# Patient Record
Sex: Male | Born: 1985 | Race: White | Hispanic: No | Marital: Single | Smoking: Never smoker
Health system: Southern US, Community
[De-identification: ages and names within clinical notes are randomized; demographics above are authoritative.]

## PROBLEM LIST (undated history)

## (undated) DIAGNOSIS — U071 COVID-19: Secondary | ICD-10-CM

## (undated) DIAGNOSIS — J45909 Unspecified asthma, uncomplicated: Secondary | ICD-10-CM

## (undated) DIAGNOSIS — J309 Allergic rhinitis, unspecified: Secondary | ICD-10-CM

## (undated) HISTORY — DX: Allergic rhinitis, unspecified: J30.9

## (undated) HISTORY — DX: COVID-19: U07.1

## (undated) HISTORY — DX: Unspecified asthma, uncomplicated: J45.909

---

## 2019-01-12 ENCOUNTER — Other Ambulatory Visit: Payer: Self-pay | Admitting: *Deleted

## 2019-01-12 DIAGNOSIS — Z20822 Contact with and (suspected) exposure to covid-19: Secondary | ICD-10-CM

## 2019-01-14 LAB — NOVEL CORONAVIRUS, NAA: SARS-CoV-2, NAA: DETECTED — AB

## 2019-02-22 ENCOUNTER — Ambulatory Visit
Admission: RE | Admit: 2019-02-22 | Discharge: 2019-02-22 | Disposition: A | Discharge status: Home / Self Care | Payer: BC Managed Care – PPO | Source: Ambulatory Visit | Attending: Family Medicine | Admitting: Family Medicine

## 2019-02-22 ENCOUNTER — Other Ambulatory Visit: Payer: Self-pay

## 2019-02-22 ENCOUNTER — Other Ambulatory Visit: Payer: Self-pay | Admitting: Family Medicine

## 2019-02-22 DIAGNOSIS — R05 Cough: Secondary | ICD-10-CM

## 2019-02-22 DIAGNOSIS — R0602 Shortness of breath: Secondary | ICD-10-CM | POA: Diagnosis present

## 2019-02-22 DIAGNOSIS — R059 Cough, unspecified: Secondary | ICD-10-CM

## 2019-04-16 ENCOUNTER — Ambulatory Visit: Payer: BC Managed Care – PPO | Attending: Internal Medicine

## 2019-04-16 DIAGNOSIS — Z20822 Contact with and (suspected) exposure to covid-19: Secondary | ICD-10-CM

## 2019-04-17 LAB — NOVEL CORONAVIRUS, NAA: SARS-CoV-2, NAA: NOT DETECTED

## 2019-04-20 ENCOUNTER — Telehealth: Payer: Self-pay | Admitting: *Deleted

## 2019-04-20 NOTE — Telephone Encounter (Signed)
Pt called for COVID results, they were not detected. Pt verbalized understanding.

## 2019-09-30 ENCOUNTER — Encounter: Payer: Self-pay | Admitting: Pulmonary Disease

## 2019-09-30 ENCOUNTER — Ambulatory Visit (INDEPENDENT_AMBULATORY_CARE_PROVIDER_SITE_OTHER): Payer: BC Managed Care – PPO | Admitting: Pulmonary Disease

## 2019-09-30 ENCOUNTER — Other Ambulatory Visit: Payer: Self-pay

## 2019-09-30 VITALS — BP 118/76 | HR 67 | Temp 97.5°F | Ht 68.0 in | Wt 167.8 lb

## 2019-09-30 DIAGNOSIS — J45909 Unspecified asthma, uncomplicated: Secondary | ICD-10-CM

## 2019-09-30 DIAGNOSIS — R0602 Shortness of breath: Secondary | ICD-10-CM

## 2019-09-30 MED ORDER — ALBUTEROL SULFATE (2.5 MG/3ML) 0.083% IN NEBU
2.5000 mg | INHALATION_SOLUTION | Freq: Four times a day (QID) | RESPIRATORY_TRACT | 12 refills | Status: AC | PRN
Start: 2019-09-30 — End: ?

## 2019-09-30 NOTE — Patient Instructions (Signed)
What we discussed today:   We are going to obtain breathing tests  We are going to obtain a heart test  Allergy testing  We will refill your nebulizer medication and put a prescription for a new nebulizer  Stop Singulair (montelukast)  We will see you in follow-up in 4 to 6 weeks time call sooner should any new difficulties arise

## 2019-09-30 NOTE — Progress Notes (Signed)
Subjective:    Patient ID: Patrick Finley, male    DOB: 07/03/1985, 34 y.o.   MRN: [id number]  HPI Patient is a 34 year old lifelong never smoker who presents for evaluation of difficulty breathing for the last 7 to 8 months.  Patient is kindly referred by Jonita Albee, FNP.  Patient states that symptoms started approximately 3 months after he had COVID-19.  Patient states that he has had asthma "all of his life" and he notes that this feeling of breathlessness is different.  He notes that he has inability to get a full breath during mild exercise and his throat begins to "close down aggressively".  He had Covid 19 in October 2020 diagnosed on 20 October.  This lingered towards the first part of November 2020.  In around December is when he started noticing his issues with the dyspnea as noted above.  He states that around that time Advair which he has used since childhood has stopped working with regards to his symptoms of dyspnea.  He states rest makes his symptoms better and exercise exacerbates them.  He does have issues with seasonal allergies.  He has chronic issues with gastroesophageal reflux even as a child.  He is on PPI for the same.  He notes chest pain and heartburn frequently as well as throat pain from reflux.  He notes that the throat pain and chest pain also get aggravated by exercise.  He does describe orthopnea and occasional paroxysmal nocturnal dyspnea that started since his Covid diagnosis.   Review of Systems A 10 point review of systems was performed and it is as noted above otherwise negative.  Past Medical History:  Diagnosis Date  . Allergic rhinitis   . Asthma   . COVID-19    History reviewed. No pertinent surgical history.  Family History  Problem Relation Age of Onset  . Fibromyalgia Mother   . Skin cancer Mother    Social History   Tobacco Use  . Smoking status: Never Smoker  . Smokeless tobacco: Never Used  Substance Use Topics  . Alcohol use: Yes   No  military history.  Has resided previously in [place].  Previously tested for tuberculosis, negative  He works as an Teaching laboratory technician.  Hobbies include martial arts.  No exotic pets.  Does have a cat as a pet.   No Known Allergies  Meds currently taking, reviewed  No immunizations or record, patient has immunizations up to date however does not wish to disclose.    Objective:   Physical Exam BP 118/76 (BP Location: Left Arm, Cuff Size: Normal)   Pulse 67   Temp (!) 97.5 F (36.4 C) (Temporal)   Ht 5\' 8"  (1.727 m)   Wt 167 lb 12.8 oz (76.1 kg)   SpO2 97%   BMI 25.51 kg/m   GENERAL: Well-developed well-nourished young man in no acute distress.  Fully ambulatory.   HEAD: Normocephalic, atraumatic.  EYES: Pupils equal, round, reactive to light.  No scleral icterus.  MOUTH: Nose/mouth/throat not examined due to masking requirements for COVID 19. NECK: Supple. No thyromegaly. Trachea midline. No JVD.  No adenopathy. PULMONARY: Good air entry bilaterally.  No adventitious sounds.   CARDIOVASCULAR: S1 and S2. Regular rate and rhythm.  No rubs, murmurs or gallops heard. ABDOMEN: Nondistended, benign. MUSCULOSKELETAL: No joint deformity, no clubbing, no edema.  NEUROLOGIC: No focal deficits, no gait disturbance, speech is fluent. SKIN: Intact,warm,dry.  Imitate exam, no rashes. PSYCH: Mood and behavior normal.  Chest x-ray performed 02/22/2019: Independently reviewed, hyperinflation present.       Assessment & Plan:     ICD-10-CM   1. Persistent asthma without complication, unspecified asthma severity  J45.909 Pulmonary Function Test [facility] Only    Allergen Panel (27) + IGE    AMB REFERRAL FOR DME   Allergen panel PFTs Continue Advair for now  2. Shortness of breath  R06.02 ECHOCARDIOGRAM COMPLETE   PFTs as above 2D echo to exclude cardiac cause of dyspnea   Orders Placed This Encounter  Procedures  . Allergen Panel (27) + IGE    Standing Status:   Future     Standing Expiration Date:   09/29/2020  . AMB REFERRAL FOR DME    Referral Priority:   Routine    Referral Type:   Durable Medical Equipment Purchase    Number of Visits Requested:   1  . Pulmonary Function Test [facility] Only    Standing Status:   Future    Number of Occurrences:   1    Standing Expiration Date:   09/29/2020    Scheduling Instructions:     urgent    Order Specific Question:   Full PFT: includes the following: basic spirometry, spirometry pre & post bronchodilator, diffusion capacity (DLCO), lung volumes    Answer:   Full PFT  . ECHOCARDIOGRAM COMPLETE    Standing Status:   Future    Number of Occurrences:   1    Standing Expiration Date:   01/31/2020    Order Specific Question:   Where should this test be performed    Answer:   [facility]    Order Specific Question:   Please indicate who you request to read the echo results.    Answer:   [facility] Read[facility]    Order Specific Question:   Perflutren DEFINITY (image enhancing agent) should be administered unless hypersensitivity or allergy exist    Answer:   Administer Perflutren    Order Specific Question:   Reason for exam-Echo    Answer:   Dyspnea  786.09 / R06.00   Discussion:  Suspect the patient has poorly consider asthma.  We will see with obtaining PFT.  In addition will obtain echocardiogram to evaluate for potential cardiac sources of dyspnea.  Patient does have issues with severe gastroesophageal reflux and recommend that he continue antireflux measures as this can also aggravate asthma.  There to be currently on any PPIs would recommend a trial of this.  We will see the patient in follow-up after his 2D echo, PFTs and blood work are available.  Gailen Shelter, MD [facility]   *This note was dictated using voice recognition software/Dragon.  Despite best efforts to proofread, errors can occur which can change the meaning.  Any change was purely unintentional.

## 2019-10-08 ENCOUNTER — Telehealth: Payer: Self-pay | Admitting: Pulmonary Disease

## 2019-10-08 NOTE — Telephone Encounter (Signed)
Spoke to [place] with [employer] and verified below message.    Will route to Dr. Jayme Cloud as an [place] Childes.

## 2019-10-08 NOTE — Telephone Encounter (Signed)
Noted  

## 2019-10-12 ENCOUNTER — Telehealth: Payer: Self-pay

## 2019-10-12 NOTE — Telephone Encounter (Signed)
ATC pt to relay date/time of covid test- mailbox has not been setup.   [facility]

## 2019-10-13 NOTE — Telephone Encounter (Signed)
Pt returned call and stated " his work schedule has went crazy and he didn't know at the moment when he can come in to have his PFT and Echo, so he said he will have to call back later. But cancel both his PFT and Echo".  Called and spoke with Britta Mccreedy and cancel both PFT and Echo appointments and sent message to [facility] to cancel COVID test.   Pt stated that he would call us back to R/S the PFT and Echo. Nothing else needed at this time. [name] J Cobb

## 2019-10-13 NOTE — Telephone Encounter (Signed)
Spoke to pt, who is requesting to reschedule PFT.   [name], please advise. Thanks

## 2019-10-13 NOTE — Telephone Encounter (Signed)
ATC patient, no answer and no VM set up.  Pt has PFT and 2D Echo scheduled for the same day.  Need to know if patient wants to cancel both PFT and 2D Echo are just the PFT.  Will attempt to reach out to patient again. [name] J Cobb

## 2019-10-13 NOTE — Telephone Encounter (Signed)
ATC patient, no answer and unable to leave message. Patrick Finley

## 2019-10-15 ENCOUNTER — Other Ambulatory Visit: Payer: BC Managed Care – PPO

## 2019-10-18 ENCOUNTER — Ambulatory Visit: Payer: BC Managed Care – PPO

## 2019-10-18 ENCOUNTER — Telehealth: Payer: Self-pay | Admitting: Pulmonary Disease

## 2019-10-18 NOTE — Telephone Encounter (Signed)
Pt R/S Echo for Friday 10/29/2019 at 11:00 at [facility]. Pt to arrive at 10:45 and check in at the Admissions/Registration Desk, no prep.   PFT has been R/S to 11/08/2019 at 11:30 at the [facility] and arrive at 11:15 and check in at the Admission/Registration Desk.  COVID Test will be on Friday 11/05/2019 at [facility] between the hours of 8:00 am to 1:00 pm.  Message sent to AG to schedule COVID Test. Pt is aware of all appointments and information mailed to patient. Nothing else needed at this time. [name] J Cobb

## 2019-10-29 ENCOUNTER — Other Ambulatory Visit: Payer: Self-pay

## 2019-10-29 ENCOUNTER — Ambulatory Visit
Admission: RE | Admit: 2019-10-29 | Discharge: 2019-10-29 | Disposition: A | Discharge status: Home / Self Care | Payer: BC Managed Care – PPO | Source: Ambulatory Visit | Attending: Pulmonary Disease | Admitting: Pulmonary Disease

## 2019-10-29 DIAGNOSIS — J45909 Unspecified asthma, uncomplicated: Secondary | ICD-10-CM | POA: Insufficient documentation

## 2019-10-29 DIAGNOSIS — Z8616 Personal history of COVID-19: Secondary | ICD-10-CM | POA: Diagnosis not present

## 2019-10-29 DIAGNOSIS — R0602 Shortness of breath: Secondary | ICD-10-CM

## 2019-10-29 DIAGNOSIS — R0600 Dyspnea, unspecified: Secondary | ICD-10-CM | POA: Diagnosis not present

## 2019-10-29 LAB — ECHOCARDIOGRAM COMPLETE
AR max vel: 2.81 cm2
AV Area VTI: 2.91 cm2
AV Area mean vel: 2.92 cm2
AV Mean grad: 2 mmHg
AV Peak grad: 4.2 mmHg
Ao pk vel: 1.02 m/s
Area-P 1/2: 4.41 cm2
S' Lateral: 2.98 cm

## 2019-10-29 NOTE — Progress Notes (Signed)
*  PRELIMINARY RESULTS* Echocardiogram 2D Echocardiogram has been performed.  Cristela Blue 10/29/2019, 11:33 AM

## 2019-11-02 ENCOUNTER — Telehealth: Payer: Self-pay

## 2019-11-02 NOTE — Telephone Encounter (Signed)
Pt is aware of date/time of covid test prior to PFT.  

## 2019-11-05 ENCOUNTER — Other Ambulatory Visit
Admission: RE | Admit: 2019-11-05 | Discharge: 2019-11-05 | Disposition: A | Discharge status: Home / Self Care | Payer: BC Managed Care – PPO | Source: Ambulatory Visit | Attending: Pulmonary Disease | Admitting: Pulmonary Disease

## 2019-11-05 ENCOUNTER — Other Ambulatory Visit: Payer: Self-pay

## 2019-11-05 DIAGNOSIS — Z20822 Contact with and (suspected) exposure to covid-19: Secondary | ICD-10-CM | POA: Diagnosis not present

## 2019-11-05 DIAGNOSIS — Z01812 Encounter for preprocedural laboratory examination: Secondary | ICD-10-CM | POA: Insufficient documentation

## 2019-11-06 LAB — SARS CORONAVIRUS 2 (TAT 6-24 HRS): SARS Coronavirus 2: NEGATIVE

## 2019-11-08 ENCOUNTER — Ambulatory Visit: Payer: BC Managed Care – PPO

## 2019-11-08 ENCOUNTER — Ambulatory Visit: Payer: BC Managed Care – PPO | Attending: Pulmonary Disease

## 2019-11-08 ENCOUNTER — Other Ambulatory Visit: Payer: Self-pay

## 2019-11-08 DIAGNOSIS — J45909 Unspecified asthma, uncomplicated: Secondary | ICD-10-CM | POA: Insufficient documentation

## 2019-11-08 MED ORDER — ALBUTEROL SULFATE (2.5 MG/3ML) 0.083% IN NEBU
2.5000 mg | INHALATION_SOLUTION | Freq: Once | RESPIRATORY_TRACT | Status: AC
  Administered 2019-11-08: 2.5 mg via RESPIRATORY_TRACT
  Filled 2019-11-08: qty 3

## 2019-11-11 ENCOUNTER — Other Ambulatory Visit: Payer: Self-pay

## 2019-11-11 ENCOUNTER — Encounter: Payer: Self-pay | Admitting: Pulmonary Disease

## 2019-11-11 ENCOUNTER — Ambulatory Visit (INDEPENDENT_AMBULATORY_CARE_PROVIDER_SITE_OTHER): Payer: BC Managed Care – PPO | Admitting: Pulmonary Disease

## 2019-11-11 VITALS — BP 122/70 | HR 86 | Temp 98.2°F | Ht 68.0 in | Wt 168.0 lb

## 2019-11-11 DIAGNOSIS — K219 Gastro-esophageal reflux disease without esophagitis: Secondary | ICD-10-CM

## 2019-11-11 DIAGNOSIS — R0602 Shortness of breath: Secondary | ICD-10-CM | POA: Diagnosis not present

## 2019-11-11 DIAGNOSIS — J45909 Unspecified asthma, uncomplicated: Secondary | ICD-10-CM | POA: Diagnosis not present

## 2019-11-11 MED ORDER — TRELEGY ELLIPTA 200-62.5-25 MCG/INH IN AEPB: 1.0000 | INHALATION_SPRAY | Freq: Every day | RESPIRATORY_TRACT | 11 refills | Status: DC

## 2019-11-11 MED ORDER — ALBUTEROL SULFATE HFA 108 (90 BASE) MCG/ACT IN AERS: 2.0000 | INHALATION_SPRAY | Freq: Four times a day (QID) | RESPIRATORY_TRACT | 3 refills | Status: AC | PRN

## 2019-11-11 NOTE — Progress Notes (Signed)
Subjective:    Patient ID: Patrick Finley, male    DOB: 03-Apr-1985, 34 y.o.   MRN: [id number]  HPI Patient is a 34 year old lifelong never smoker with a history of asthma since age 34, who follows up for the issue of dyspnea.  He was initially evaluated on 30 September 2019.  Continues to have issues with his throat "closing" when he tries to exercise.  Does have issues with severe gastroesophageal reflux.  He is currently not on PPI.  He has been on Advair for many years and states that this is no longer effective.  Is to use his albuterol frequently.  He has not had any fevers, chills or sweats.  No cough or sputum production.  No other significant change from the history given previously.   Review of Systems A 10 point review of systems was performed and it is as noted above otherwise negative.  No Known Allergies  Current Meds  Medication Sig  . ADVAIR DISKUS 250-50 MCG/DOSE AEPB Inhale 1 puff into the lungs in the morning and at bedtime.  Marland Kitchen albuterol (PROVENTIL) (2.5 MG/3ML) 0.083% nebulizer solution Take 3 mLs (2.5 mg total) by nebulization every 6 (six) hours as needed for wheezing or shortness of breath.  Marland Kitchen albuterol (VENTOLIN HFA) 108 (90 Base) MCG/ACT inhaler SMARTSIG:2 Puff(s) By Mouth Every 4-6 Hours PRN  . fluticasone (FLONASE) 50 MCG/ACT nasal spray Place into both nostrils daily.   Patient states he is up-to-date on immunizations however does not want to disclose.     Objective:   Physical Exam BP 122/70 (BP Location: Left Arm, Cuff Size: Normal)   Pulse 86   Temp 98.2 F (36.8 C) (Temporal)   Ht 5\' 8"  (1.727 m)   Wt 168 lb (76.2 kg)   SpO2 97%   BMI 25.54 kg/m   GENERAL: Well-developed well-nourished young man in no acute distress.  Fully ambulatory.   HEAD: Normocephalic, atraumatic.  EYES: Pupils equal, round, reactive to light.  No scleral icterus.  MOUTH: Nose/mouth/throat not examined due to masking requirements for COVID 19. NECK: Supple. No thyromegaly. Trachea  midline. No JVD.  No adenopathy. PULMONARY: Good air entry bilaterally.  No adventitious sounds.   CARDIOVASCULAR: S1 and S2. Regular rate and rhythm.  No rubs, murmurs or gallops heard. ABDOMEN: Nondistended, benign. MUSCULOSKELETAL: No joint deformity, no clubbing, no edema.  NEUROLOGIC: No focal deficits, no gait disturbance, speech is fluent. SKIN: Intact,warm,dry.  Imitate exam, no rashes. PSYCH: Mood and behavior normal.  Performed on 29 October 2019: Essentially normal 2D echo with LVEF of 55 to 60%.  PFTs performed 08 November 2019: Normal.     Assessment & Plan:     ICD-10-CM   1. Shortness of breath  R06.02 Cardiopulmonary exercise test    CBC w/Diff    Allergen Panel (27) + IGE   PFTs and echocardiogram have not revealed cause Will obtain cardiopulmonary stress test  2. Persistent asthma without complication, unspecified asthma severity  J45.909    Trial of Trelegy Ellipta 200/62.5/25 1 inhalation daily Discontinue Advair  3. Chronic GERD  K21.9    Consider PPI trial Defer to primary practitioner   Orders Placed This Encounter  Procedures  . Cardiopulmonary exercise test    Standing Status:   Future    Standing Expiration Date:   11/10/2020    Order Specific Question:   Where should this test be performed?    Answer:   11/12/2020  . CBC w/Diff  Standing Status:   Future    Standing Expiration Date:   11/10/2020  . Allergen Panel (27) + IGE    Standing Status:   Future    Standing Expiration Date:   11/10/2020    Discussion:  We will make a change to Trelegy Ellipta as noted above to see if this will help the patient.  Ordering a cardiopulmonary stress test to make sure there is no abnormality of cardiopulmonary interaction.  The patient continues to have issues with gastroesophageal reflux recommend trial of PPI or referral to gastroenterology.  Uncontrolled GERD can aggravate asthma.  We will see the patient in follow-up in 3 months time he is to contact us  prior to that time should any new difficulties arise.   Gailen Shelter, MD [facility]   *This note was dictated using voice recognition software/Dragon.  Despite best efforts to proofread, errors can occur which can change the meaning.  Any change was purely unintentional.

## 2019-11-11 NOTE — Patient Instructions (Addendum)
We are getting a cardiopulmonary stress test  I have switch your inhaler to Trelegy Ellipta 200/62.5/25, 1 puff daily.  Make sure you rinse your mouth well after you use it.  We will reorder the blood work we wanted to get last time.  We will see you in follow-up in 3 months time call sooner should any new problems arise.

## 2019-11-30 ENCOUNTER — Encounter (HOSPITAL_COMMUNITY): Payer: BC Managed Care – PPO

## 2019-12-26 IMAGING — CR DG CHEST 2V
1 series · 2 of 2 positions shown · non-contrast
Comparison: No recent prior.

CLINICAL DATA: Cough.  Shortness of breath.

EXAM:
CHEST - 2 VIEW

[Series 1: dg chest 2 view · 0.14mm/px · 2 of 2 slices shown]
[im 1/2]
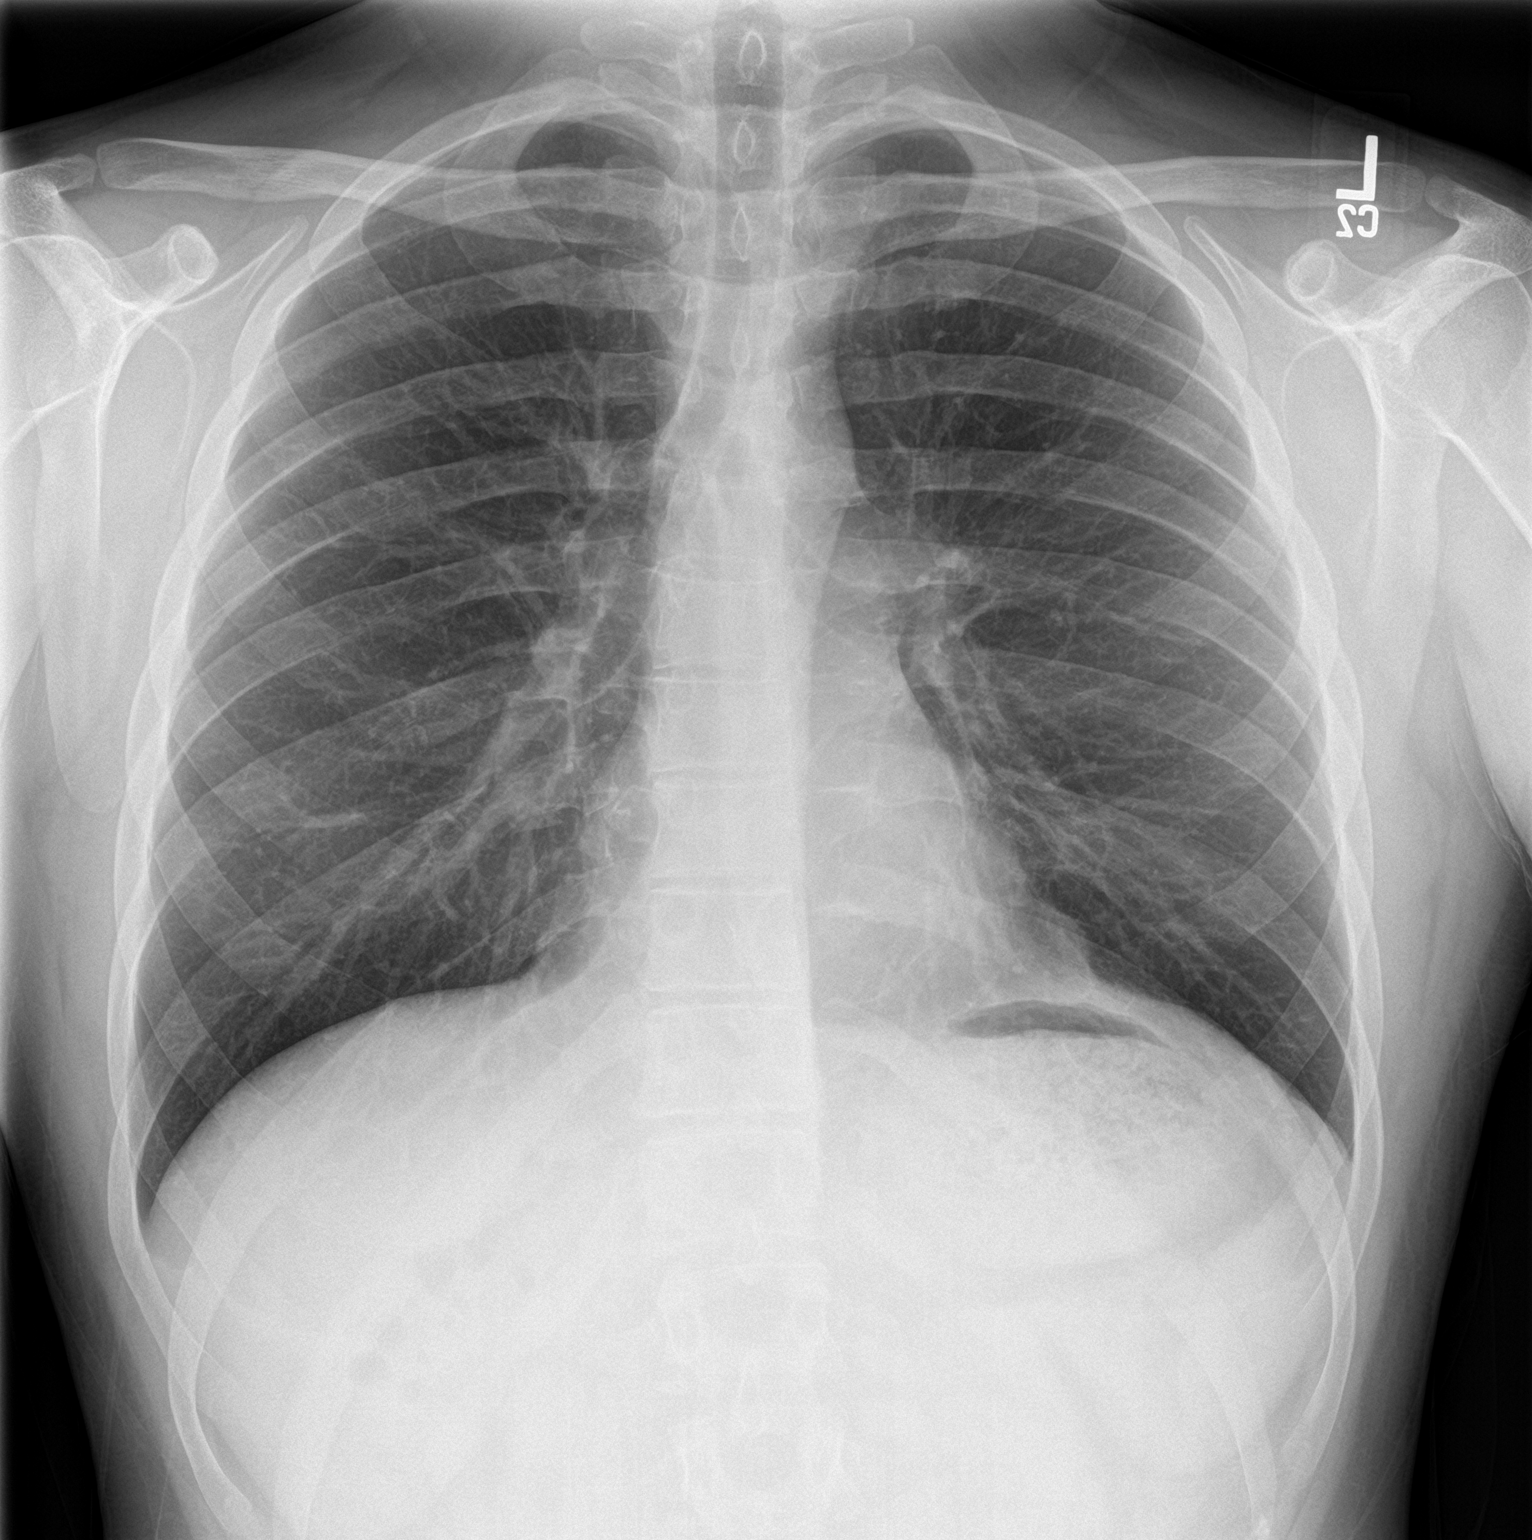
[im 2/2]
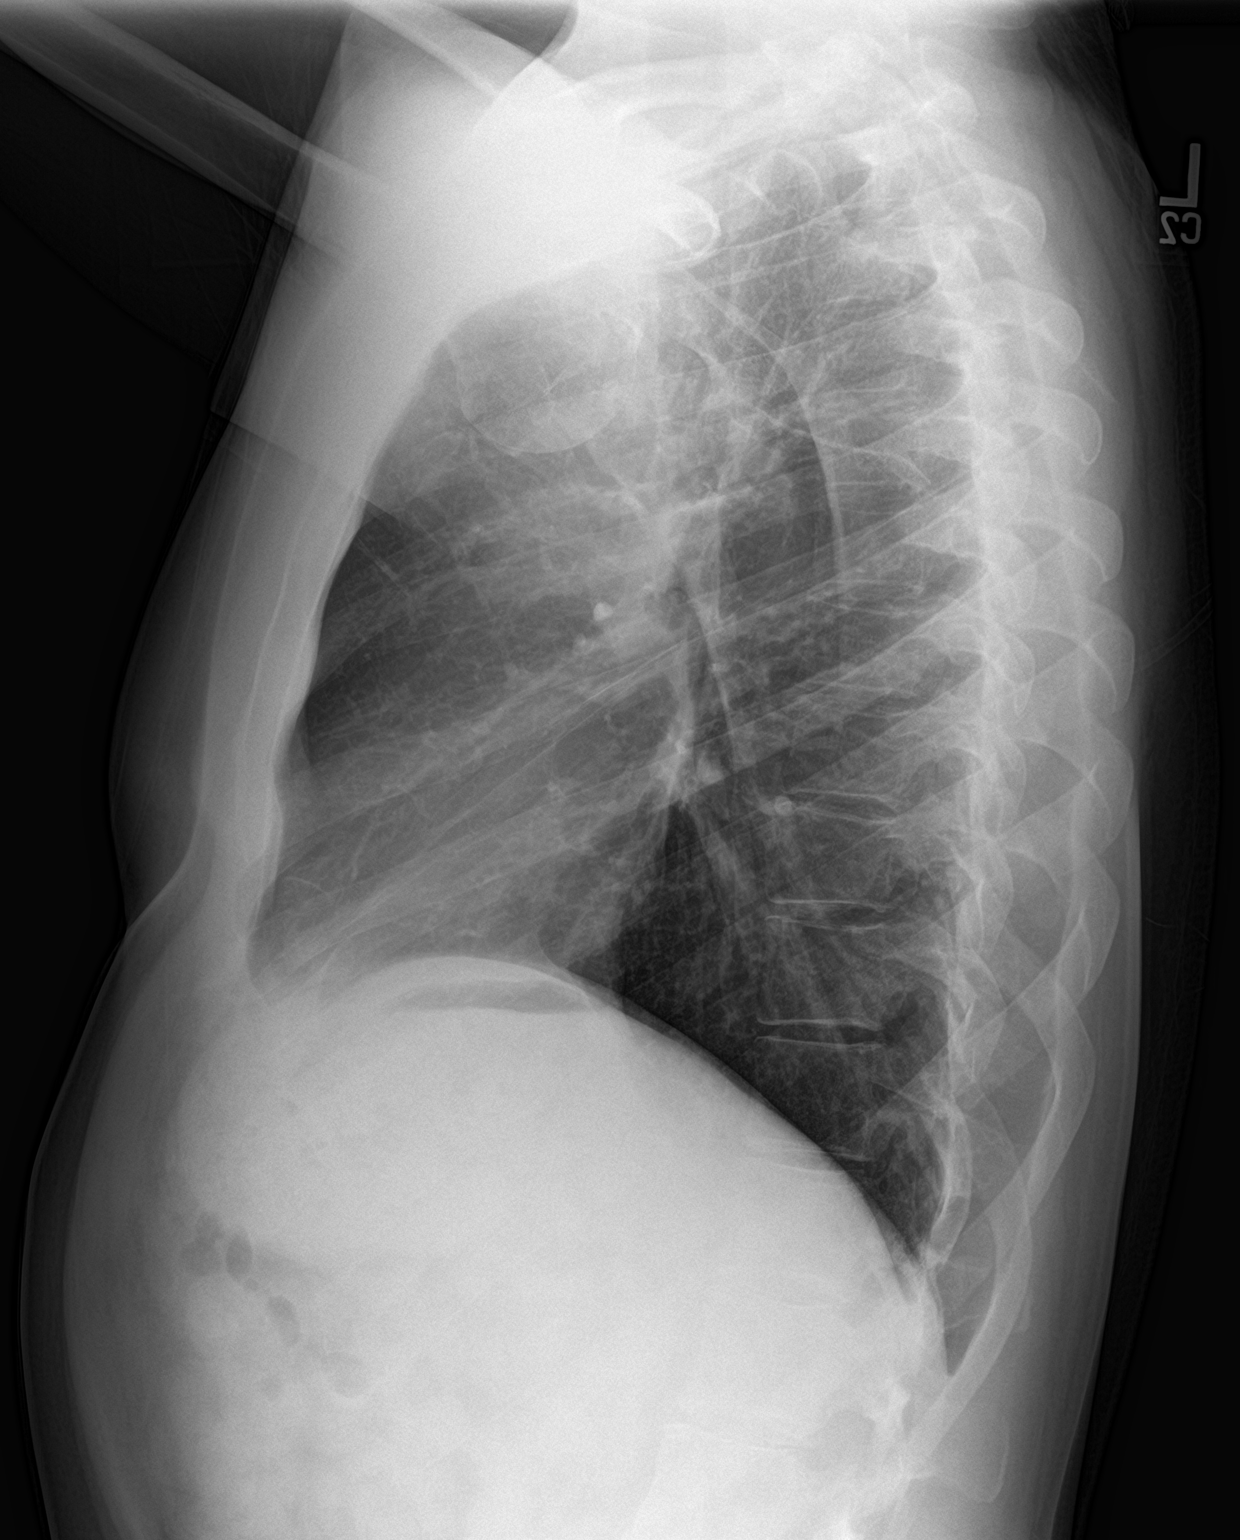

[2 of 2 positions shown; findings below may reference images not displayed]

FINDINGS: Mediastinum and hilar structures normal. Lungs are clear of acute
infiltrates. No pleural effusion or pneumothorax. Heart size normal.
Mild thoracic spine scoliosis concave left. No acute bony
abnormality.
IMPRESSION: No acute cardiopulmonary disease.

## 2020-11-23 ENCOUNTER — Other Ambulatory Visit: Payer: Self-pay | Admitting: Pulmonary Disease

## 2022-06-07 ENCOUNTER — Ambulatory Visit: Payer: BC Managed Care – PPO | Admitting: Urology

## 2022-06-07 ENCOUNTER — Encounter: Payer: Self-pay | Admitting: Urology

## 2022-06-07 VITALS — BP 126/92 | HR 80 | Ht 68.0 in | Wt 158.0 lb

## 2022-06-07 DIAGNOSIS — Z3009 Encounter for other general counseling and advice on contraception: Secondary | ICD-10-CM

## 2022-06-07 MED ORDER — DIAZEPAM 10 MG PO TABS: ORAL_TABLET | ORAL | 0 refills | Status: AC

## 2022-06-07 NOTE — Progress Notes (Signed)
06/07/2022 1:13 PM   Patrick Finley 21-Jul-1985 [id number]  Referring provider: Serita Butcher, [place],  [place] [address]  Chief Complaint  Patient presents with   VAS Consult    HPI: Patrick Finley is a 37 y.o. male who presents for vasectomy counseling.  1 child 2.5 months old Denies prior history urologic problems including chronic scrotal pain, epididymitis or orchitis No previous history inguinal hernia or pelvic surgery No history of bleeding or clotting disorders   PMH: Past Medical History:  Diagnosis Date   Allergic rhinitis    Asthma    COVID-19     Surgical History: History reviewed. No pertinent surgical history.  Home Medications:  Allergies as of 06/07/2022   No Known Allergies      Medication List        Accurate as of June 07, 2022  1:13 PM. If you have any questions, ask your nurse or doctor.          STOP taking these medications    fluticasone 50 MCG/ACT nasal spray Commonly known as: FLONASE Stopped by: Abbie Sons, MD   Trelegy Ellipta 200-62.5-25 MCG/ACT Aepb Generic drug: Fluticasone-Umeclidin-Vilant Stopped by: Abbie Sons, MD       TAKE these medications    albuterol (2.5 MG/3ML) 0.083% nebulizer solution Commonly known as: PROVENTIL Take 3 mLs (2.5 mg total) by nebulization every 6 (six) hours as needed for wheezing or shortness of breath.   albuterol 108 (90 Base) MCG/ACT inhaler Commonly known as: VENTOLIN HFA Inhale 2 puffs into the lungs every 6 (six) hours as needed for wheezing or shortness of breath.   omeprazole 20 MG capsule Commonly known as: PRILOSEC Take 20 mg by mouth daily.        Allergies: No Known Allergies  Family History: Family History  Problem Relation Age of Onset   Fibromyalgia Mother    Skin cancer Mother     Social History:  reports that he has never smoked. He has never used smokeless tobacco. He reports current alcohol use. He reports that he does  not use drugs.   Physical Exam: BP (!) 126/92   Pulse 80   Ht 5\' 8"  (1.727 m)   Wt 158 lb (71.7 kg)   BMI 24.02 kg/m   Constitutional:  Alert and oriented, No acute distress. HEENT: [place] AT Respiratory: Normal respiratory effort, no increased work of breathing. GU: Phallus without lesions, testes descended bilaterally without masses or tenderness, spermatic cord/epididymis palpably normal bilaterally.  Vasa easily palpable bilaterally Skin: No rashes, bruises or suspicious lesions. Psychiatric: Normal mood and affect.   Assessment & Plan:    1.  Undesired fertility Desires to schedule vasectomy We had a long discussion about vasectomy. We specifically discussed the procedure, recovery and the risks, benefits and alternatives of vasectomy. I explained that the procedure entails removal of a segment of each vas deferens, each of which conducts sperm, and that the purpose of this procedure is to cause sterility (inability to produce children or cause pregnancy). Vasectomy is intended to be permanent and irreversible form of contraception. Options for fertility after vasectomy include vasectomy reversal, or sperm retrieval with in vitro fertilization. These options are not always successful, and they may be expensive. We discussed reversible forms of birth control such as condoms, IUD or diaphragms, as well as the option of freezing sperm in a sperm bank prior to the vasectomy procedure. We discussed the importance of avoiding strenuous  exercise for four days after vasectomy, and the importance of refraining from any form of ejaculation for seven days after vasectomy. I explained that vasectomy does not produce immediate sterility so another form of contraceptive must be used until sterility is assured by having semen checked for sperm. Thus, a post vasectomy semen analysis is necessary to confirm sterility. Rarely, vasectomy must be repeated. We discussed the approximately 1 in 2,000 risk of  pregnancy after vasectomy for men who have post-vasectomy semen analysis showing absent sperm or rare non-motile sperm. Typical side effects include a small amount of oozing blood, some discomfort and mild swelling in the area of incision.  Vasectomy does not affect sexual performance, function, please, sensation, interest, desire, satisfaction, penile erection, volume of semen or ejaculation. Other rare risks include allergy or adverse reaction to an anesthetic, testicular atrophy, hematoma, infection/abscess, prolonged tenderness of the vas deferens, pain, swelling, painful nodule or scar (called sperm granuloma) or epididymtis. We discussed chronic testicular pain syndrome. This has been reported to occur in as many as 1-2% of men and may be permanent. This can be treated with medication, small procedures or (rarely) surgery. He requested a preprocedure anxiolytic and Rx Valium 10 mg was sent to pharmacy.  He was informed he would need a driver due to the the sedating effects of this medication   Abbie Sons, MD  [facility] [address], [place], [place] [address] [phone]

## 2022-06-07 NOTE — Patient Instructions (Signed)

## 2022-07-18 ENCOUNTER — Ambulatory Visit (INDEPENDENT_AMBULATORY_CARE_PROVIDER_SITE_OTHER): Payer: BC Managed Care – PPO | Admitting: Urology

## 2022-07-18 ENCOUNTER — Encounter: Payer: Self-pay | Admitting: Urology

## 2022-07-18 VITALS — BP 147/96 | HR 98 | Ht 68.0 in | Wt 169.0 lb

## 2022-07-18 DIAGNOSIS — Z302 Encounter for sterilization: Secondary | ICD-10-CM

## 2022-07-18 MED ORDER — HYDROCODONE-ACETAMINOPHEN 10-300 MG PO TABS: 1.0000 | ORAL_TABLET | Freq: Four times a day (QID) | ORAL | 0 refills | Status: AC | PRN

## 2022-07-18 NOTE — Patient Instructions (Signed)

## 2022-07-18 NOTE — Progress Notes (Signed)
   Vasectomy Procedure Note  Indications: Patrick Finley is a 37 y.o. male who presents today for elective sterilization.  He has been consented for the procedure.  He is aware of the risks and benefits.  He had no additional questions.  He agrees to proceed.  He denies any other significant change since his last visit.  Pre-operative Diagnosis: Elective sterilization  Post-operative Diagnosis: Elective sterilization  Premedication: Valium 10 mg po  Surgeon: Lorin Picket C. Mattia Osterman, M.D  Description: The patient was prepped and draped in the standard fashion.  The right vas deferens was identified and brought superiorly to the anterior scrotal skin.  The skin and vas were then anesthetized utilizing 8 ml 1% lidocaine.  A small stab incision was made and spread with the vas dissector.  The vas was grasped utilizing the vas clamp and elevated out of the incision.  The vas sheath was incised. The vas was elevated and dissected free from surrounding tissue and vessels. A ~1 cm segment was excised.  The vas lumens were cauterized utilizing electrocautery.  The distal segment was buried in the surrounding sheath with a 3-0 chromic suture.  No significant bleeding was observed.  The vas ends were then dropped back into the hemiscrotum.  The skin was closed with hemostatic pressure.  An identical procedure was performed on the contralateral side.  Clean dry gauze was applied to the incision sites.  The patient tolerated the procedure well.  Complications:None  Recommendations: 1.  No lifting greater than 10 pounds or strenuous activity for 1 week. 2.  Scrotal support for 1-2 weeks. 3.  May shower in 24 hours; no bath, hot tub for 1 week 4.  No intercourse for at least 7 days and resume based on level of discomfort  5.  Continue alternate contraception for 12 weeks.  6.  Call for significant pain, swelling, redness, drainage or fever greater than 100.5. 7.  Rx hydrocodone/APAP 10/300 1-2 every 6 hours prn  pain. 8.  Follow-up semen analysis in 12 weeks.   Irineo Axon, MD

## 2022-10-16 ENCOUNTER — Other Ambulatory Visit: Payer: Self-pay

## 2022-10-16 DIAGNOSIS — Z302 Encounter for sterilization: Secondary | ICD-10-CM

## 2022-10-17 ENCOUNTER — Other Ambulatory Visit: Payer: BC Managed Care – PPO

## 2022-10-17 DIAGNOSIS — Z302 Encounter for sterilization: Secondary | ICD-10-CM

## 2022-10-21 ENCOUNTER — Telehealth: Payer: Self-pay | Admitting: *Deleted

## 2022-10-21 NOTE — Telephone Encounter (Signed)
-----   Message from Verna Czech [facility] sent at 10/19/2022  9:32 PM EDT ----- Semen analysis showed no sperm present.  Okay to use vasectomy as primary contraception

## 2022-10-21 NOTE — Telephone Encounter (Signed)
Notified patient as instructed, patient pleased °

## 2022-12-17 ENCOUNTER — Encounter: Payer: Self-pay | Admitting: Urology
